# Patient Record
Sex: Male | Born: 2000 | Race: White | Hispanic: No | Marital: Single | Smoking: Never smoker
Health system: Southern US, Community
[De-identification: ages and names within clinical notes are randomized; demographics above are authoritative.]

---
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
## 2022-08-12 IMAGING — CT CT CHEST W/O CM
3 of 6 series · 17 of 36 positions shown, 19 images · non-contrast
Comparison: Chest x-ray 10/01/2020

CLINICAL DATA: CT Chest WO Contrast Patient was brought in by
Mother with c/o vomiting since 8 pm yesterday; Patient says he has
thrown up more times than he can count since then. Chest wall pain
Please give oral contrast for possible esophageal perforation

EXAM:
CT CHEST WITHOUT CONTRAST
TECHNIQUE: Multidetector CT imaging of the chest was performed following the
standard protocol without IV contrast.

[Series 4: thorax 2.0 · axial · 0.72mm/px · z∈[+1108,+1398]mm · 8 of 187 slices shown, 10 images (1 of 2)]
[im 21/187  mediastinal]
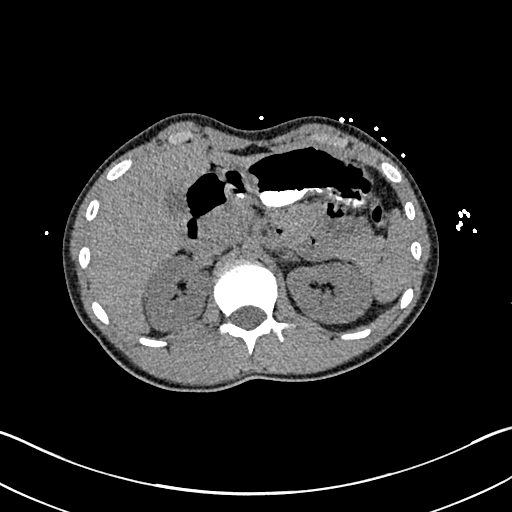
[im 21/187  lung]
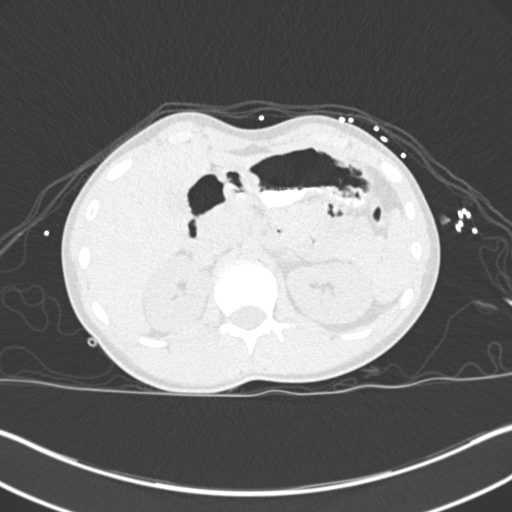
[im 42/187  lung]
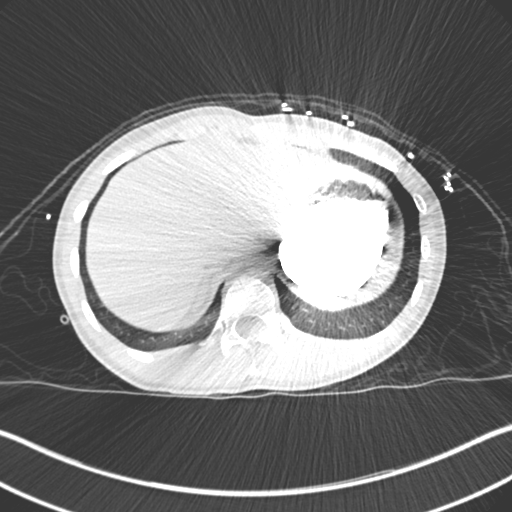
[im 63/187  lung]
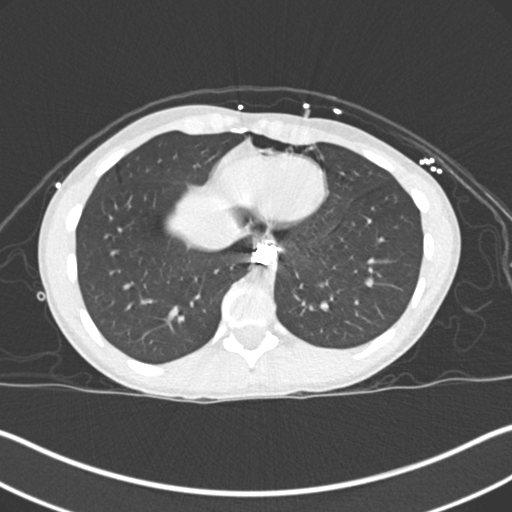
[im 83/187  lung]
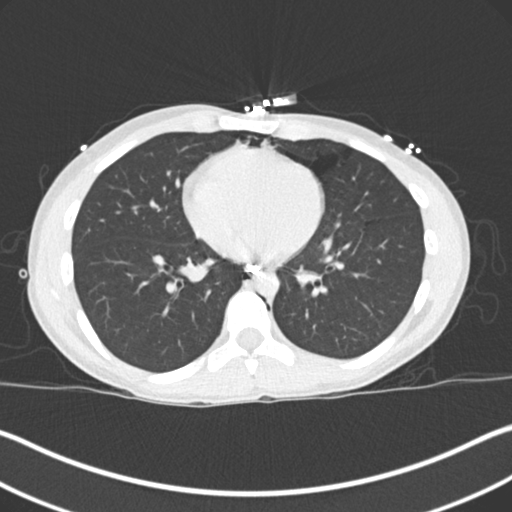
[im 104/187  mediastinal]
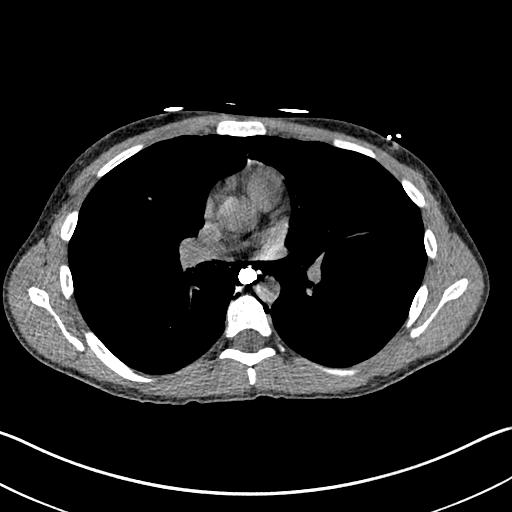
[im 104/187  lung]
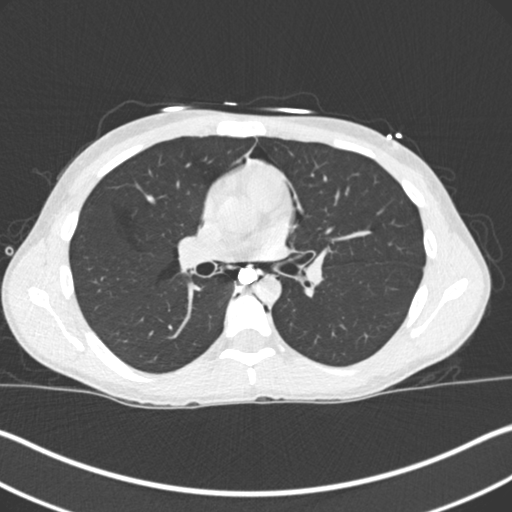
[im 125/187  lung]
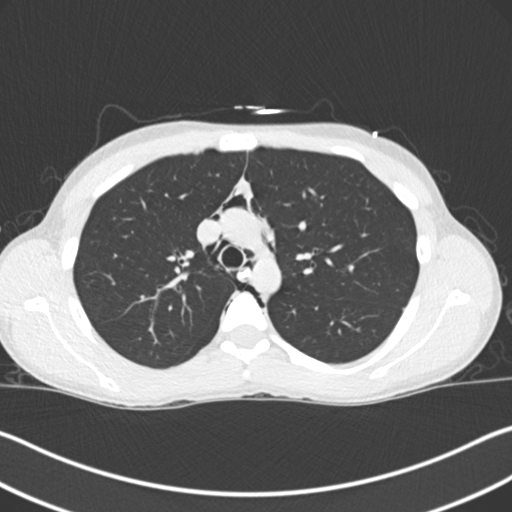
[im 145/187  lung]
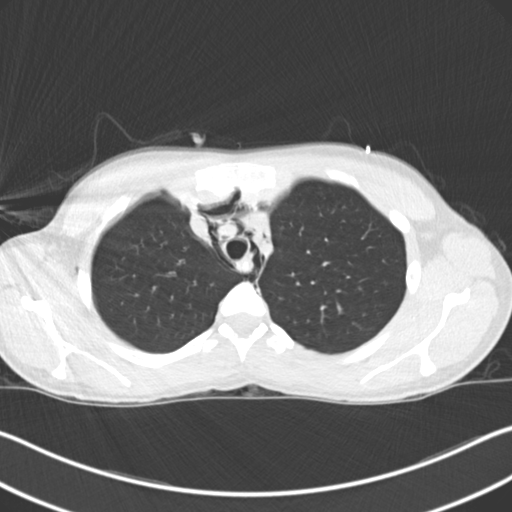
[im 166/187  lung]
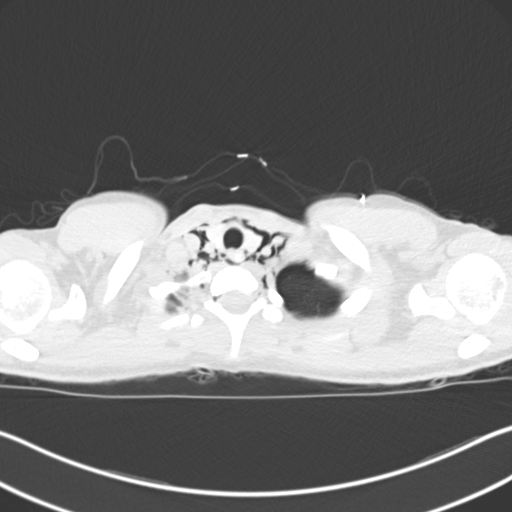

[Series 6: coronal · coronal · 0.70mm/px · 3 of 76 slices shown]
[im 16/76  lung]
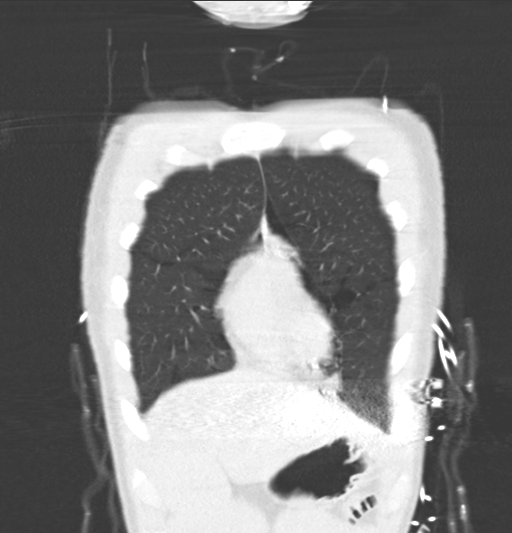
[im 31/76  lung]
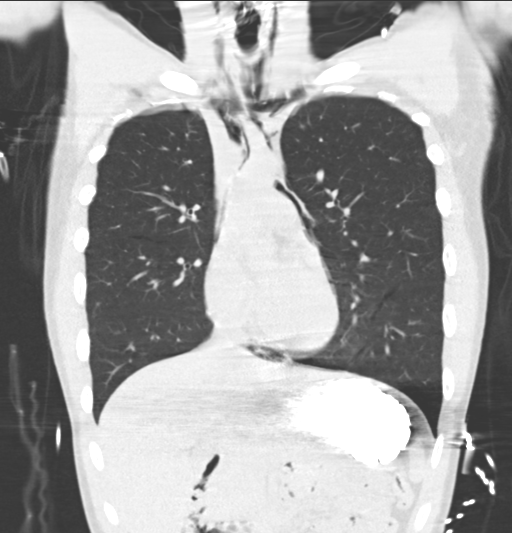
[im 46/76  lung]
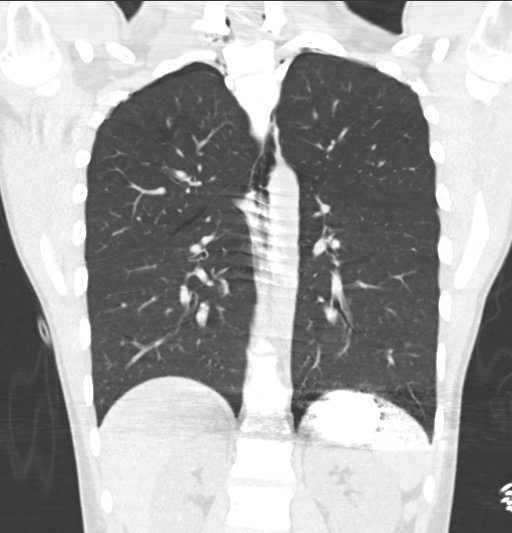

[Series 10: thorax 2.0 · axial · 0.72mm/px · z∈[+1108,+1316]mm · 6 of 187 slices shown (2 of 2)]
[im 21/187  lung]
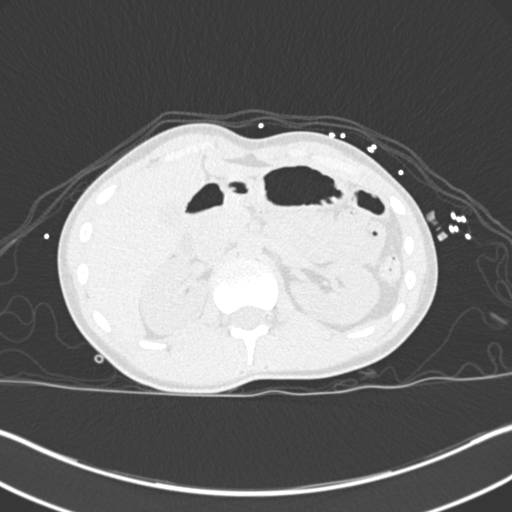
[im 42/187  lung]
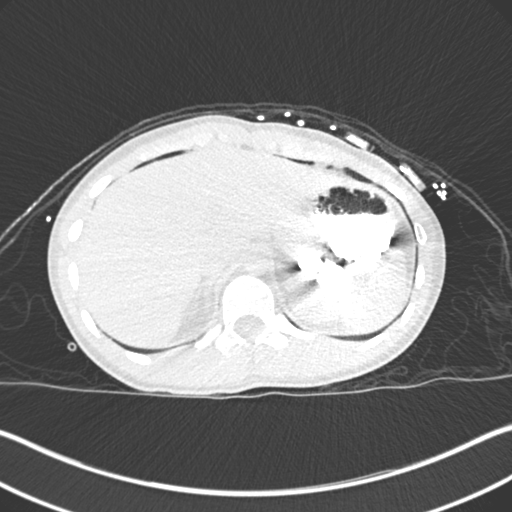
[im 63/187  lung]
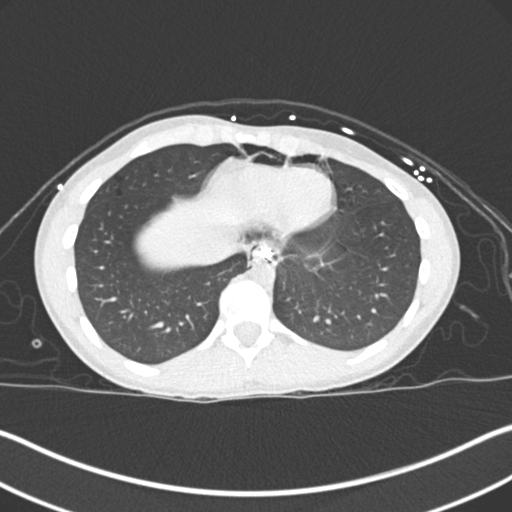
[im 83/187  lung]
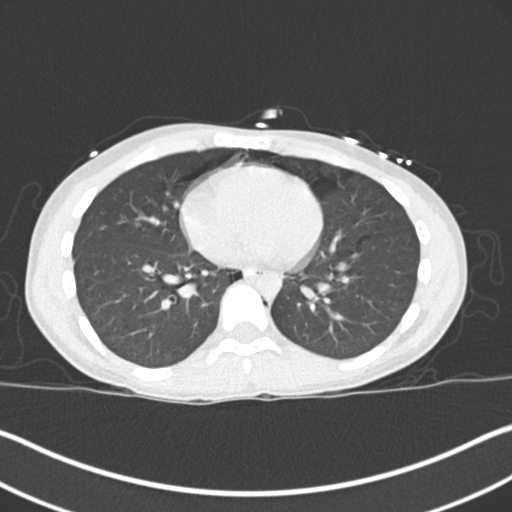
[im 104/187  lung]
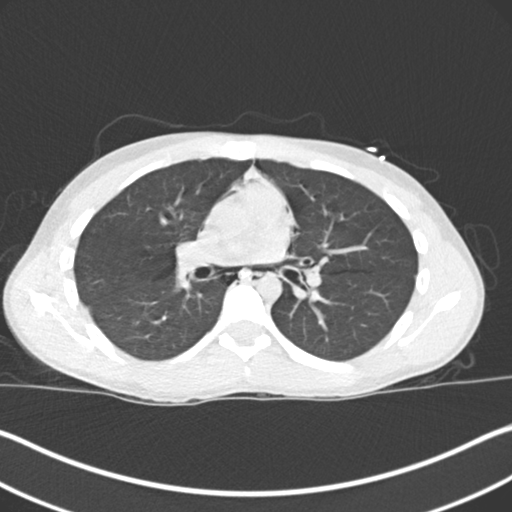
[im 125/187  lung]
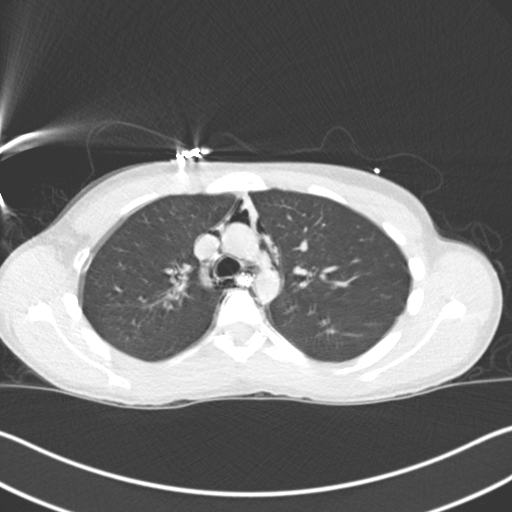

[17 of 36 positions shown; findings below may reference images not displayed]

FINDINGS: Cardiovascular: No significant vascular findings. Normal heart size.
No pericardial effusion. No pneumopericardium identified.

Mediastinum/Nodes: Moderate to large volume pneumomediastinum. No
gross hilar adenopathy, noting limited sensitivity for the detection
of hilar adenopathy on this noncontrast study. No enlarged
mediastinal or axillary lymph nodes. Thyroid gland, trachea, and
esophagus demonstrate no significant findings. No PO contrast
extravasation from the esophageal lumen. No fat stranding within the
posterior mediastinum.

Lungs/Pleura: Trace biapical pneumothoraces. No pleural effusions.
No focal consolidation. No pulmonary nodule. No pulmonary mass.

Upper Abdomen: No acute abnormality.

Musculoskeletal: No chest wall mass or suspicious bone lesions
identified.
IMPRESSION: Moderate to large volume pneumomediastinum and associated trace
biapical pneumothoraces. No findings to suggest esophageal
perforation. No findings to suggest a tension component with limited
evaluation on this noncontrast study.

These results were called by telephone at the time of interpretation
on 10/01/2020 at [DATE] to provider ZUBIN JAMIL , who verbally
acknowledged these results.

## 2022-08-12 IMAGING — DX DG CHEST 1V PORT
1 series · 1 of 1 positions shown · non-contrast
Comparison: None.

CLINICAL DATA: Chest pain and vomiting

EXAM:
PORTABLE CHEST 1 VIEW

[chest]
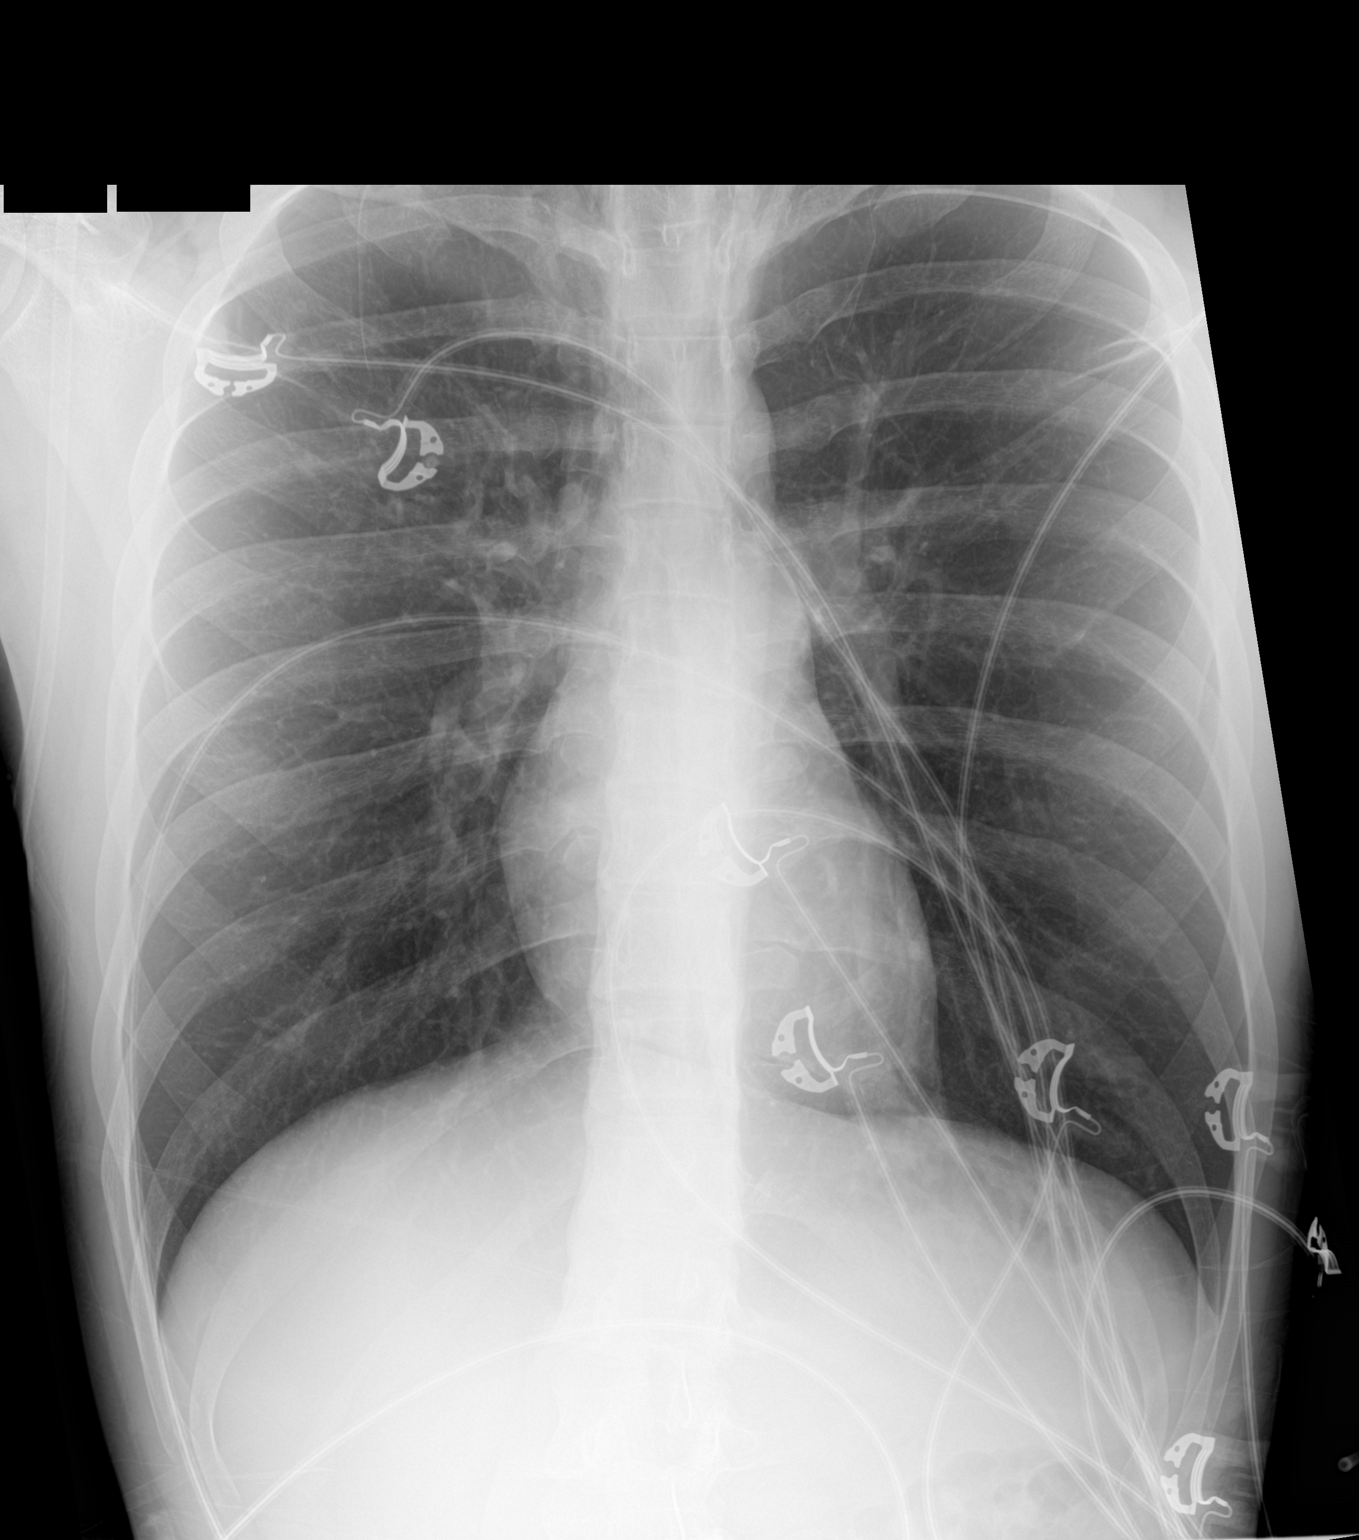

[1 of 1 positions shown; findings below may reference images not displayed]

FINDINGS: Numerous leads and wires project over the chest. Midline trachea.
Normal heart size. No pleural effusion or pneumothorax. Lucencies
over the upper chest and lower neck, highly suspicious for
pneumomediastinum and possible subcutaneous air. No lobar
consolidation.
IMPRESSION: Suspicion of pneumomediastinum and lower cervical subcutaneous
emphysema. Consider further evaluation with chest CT.

A called to the emergency room is pending at [DATE] p.m.
